# Patient Record
Sex: Female | Born: 1955 | Race: White | Hispanic: No | Marital: Single | Smoking: Never smoker
Health system: Southern US, Community
[De-identification: ages and names within clinical notes are randomized; demographics above are authoritative.]

## PROBLEM LIST (undated history)

## (undated) DIAGNOSIS — E7800 Pure hypercholesterolemia, unspecified: Secondary | ICD-10-CM

## (undated) HISTORY — PX: OTHER SURGICAL HISTORY: SHX169

## (undated) HISTORY — PX: SPLENECTOMY, TOTAL: SHX788

---

## 2016-08-03 ENCOUNTER — Encounter (HOSPITAL_COMMUNITY): Payer: Self-pay | Admitting: *Deleted

## 2016-08-03 ENCOUNTER — Emergency Department (HOSPITAL_COMMUNITY)
Admission: EM | Admit: 2016-08-03 | Discharge: 2016-08-03 | Disposition: A | Discharge status: Home / Self Care | Payer: Self-pay | Attending: Emergency Medicine | Admitting: Emergency Medicine

## 2016-08-03 DIAGNOSIS — Y999 Unspecified external cause status: Secondary | ICD-10-CM | POA: Insufficient documentation

## 2016-08-03 DIAGNOSIS — R220 Localized swelling, mass and lump, head: Secondary | ICD-10-CM

## 2016-08-03 DIAGNOSIS — Y939 Activity, unspecified: Secondary | ICD-10-CM | POA: Insufficient documentation

## 2016-08-03 DIAGNOSIS — Y929 Unspecified place or not applicable: Secondary | ICD-10-CM | POA: Insufficient documentation

## 2016-08-03 DIAGNOSIS — S0083XA Contusion of other part of head, initial encounter: Secondary | ICD-10-CM | POA: Insufficient documentation

## 2016-08-03 DIAGNOSIS — W01198A Fall on same level from slipping, tripping and stumbling with subsequent striking against other object, initial encounter: Secondary | ICD-10-CM | POA: Insufficient documentation

## 2016-08-03 NOTE — ED Triage Notes (Signed)
To ED for eval of facial and eye swelling. Pt states she was moving a dresser on Wednesday when then dresser fell and hit her in forehead. Pt states she had a big 'knot' on her head that she had been icing since. Thursday and Friday noted bruising in her face. The bruising is moving down her face so she wanted to make sure this was ok. Denies pain, dizziness, nausea, vomiting, visual changes.

## 2016-08-03 NOTE — Discharge Instructions (Addendum)
Follow-up with her primary care doctor next 24-48 hours. If you do not have a primary care doctor you can use one listed below.   Stopped taking aspirin.  Return the emergency department immediately for any vision changes, headache, difficulty walking, numbness/weakness of her arms or legs or any other worsening or concerning symptoms.  If you do not have a primary care doctor you see regularly, please you the list below. Please call them to arrange for follow-up.    No Primary Care Doctor Call [facility]  [phone] Other agencies that provide inexpensive medical care    [facility]  [phone]    [facility]  [phone]    [facility]  [phone]    [facility]  [phone]    [facility]  [phone]    [facility], [facility], [facility]  [phone]

## 2016-08-03 NOTE — ED Provider Notes (Signed)
[facility]-EMERGENCY DEPT Provider Note   CSN: [id number] Arrival date & time: 08/03/16  1551   By signing my name below, I, Teofilo PodMatthew P. Jamison, attest that this documentation has been prepared under the direction and in the presence of Graciella FreerLindsey Layden, [place]-C. Electronically Signed: Teofilo PodMatthew P. Jamison, ED Scribe. 08/03/2016. 5:21 PM.   History   Chief Complaint Chief Complaint  Patient presents with  . Facial Swelling    The history is provided by the patient. No language interpreter was used.   HPI Comments:  Yesenia Galvan is a 61 y.o. female who presents to the Emergency Department complaining of worsening facial swelling that began 3 days ago. 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 She denies any fall or hitting her head. She remembers the event, and denies LOC. Pt notes worsening bruises on her face, and states that the bruising has been gradually moving down her face. No alleviating factors noted.  Pt does not take any medications regularly. She is not currently on any blood thinners. She took 1x dose of ASA this morning.  She denies any visual changes, nausesa/vomiting, dizziness, weakness of her arms or legs headaches.    History reviewed. No pertinent past medical history.  There are no active problems to display for this patient.   History reviewed. No pertinent surgical history.  OB History    No data available       Home Medications    Prior to Admission medications   Not on File    Family History No family history on file.  Social History Social History  Substance Use Topics  . Smoking status: Never Smoker  . Smokeless tobacco: Never Used  . Alcohol use No     Allergies   Patient has no allergy information on record.   Review of Systems Review of Systems  Constitutional: Negative for fever.  HENT: Positive for facial swelling.   Eyes: Negative for visual disturbance.    Respiratory: Negative for cough and shortness of breath.   Cardiovascular: Negative for chest pain.  Gastrointestinal: Negative for abdominal pain, nausea and vomiting.  Skin: Positive for color change.  Neurological: Negative for weakness, light-headedness, numbness and headaches.     Physical Exam Updated Vital Signs BP 140/88 (BP Location: Left Arm)   Pulse 62   Temp 97.9 F (36.6 C) (Oral)   Resp 16   SpO2 96%   Physical Exam  Constitutional: She appears well-developed and well-nourished. No distress.  Sitting comfortably on examination table.   HENT:  Head: Normocephalic.  Right Ear: Tympanic membrane normal. No hemotympanum.  Left Ear: Tympanic membrane normal. No hemotympanum.  Mouth/Throat: Uvula is midline, oropharynx is clear and moist and mucous membranes are normal.  Hematoma to the left frontal scalp. She has diffuse ecchymosis surrounding bilateral periorbital regions. No periorbital tenderness to palpation. No deformities or crepitus noted. No tenderness or deformities to nasal bridge. Dentition is intact. Elevation/depression and lateral movement of dull is intact without difficulty. No evidence of Battle signs  Eyes: Conjunctivae and EOM are normal. Pupils are equal, round, and reactive to light.  EOMs fully intact without difficulty.   Cardiovascular: Normal rate.   Pulmonary/Chest: Effort normal.  Abdominal: She exhibits no distension.  Neurological: She is alert. GCS eye subscore is 4. GCS verbal subscore is 5. GCS motor subscore is 6.  Cranial nerves III-XII intact Follows commands, Moves all extremities  5/5 strength to BUE and BLE  Sensation intact throughout  Normal finger to nose. No dysdiadochokinesia. No pronator drift. No slurred speech. No facial droop.   Skin: Skin is warm and dry.  Psychiatric: She has a normal mood and affect.  Nursing note and vitals reviewed.    ED Treatments / Results  DIAGNOSTIC STUDIES:  Oxygen Saturation is 96%  on RA, normal by my interpretation.    COORDINATION OF CARE:  5:19 PM Discussed treatment plan with pt at bedside and pt agreed to plan.   Labs (all labs ordered are listed, but only abnormal results are displayed) Labs Reviewed - No data to display  EKG  EKG Interpretation None       Radiology No results found.  Procedures Procedures (including critical care time)  Medications Ordered in ED Medications - No data to display   Initial Impression / Assessment and Plan / ED Course  I have reviewed the triage vital signs and the nursing notes.  Pertinent labs & imaging results that were available during my care of the patient were reviewed by me and considered in my medical decision making (see chart for details).     61 year old female who presents with facial ecchymosis after a dresser fell on her face. Patient is afebrile, non-toxic appearing, sitting comfortably on examination table. Physical exam with no neuro deficits. No history of loss of consciousness, vomiting, dizziness, headache. Low suspicion for orbital wall fracture given that patient has no tenderness to palpation and EOMs are intact without pain. Given ecchymosis, will obtain CT orbit to evaluate.   Patient asked to see provider. She does not want to stay and wait for the CT orbit. She states that she feels fine and since she is not having any symptoms, does not wish to wait for it. Explained to her that ecchymosis will tend to travel downwards due to gravity, but also explained that without a CT orbit, I can't be sure she doesn't have an underlying fracture. Patient states that she understands the risk of leaving without a CT and wishes to do so anyway. Re-examination shows that patient still has full EOMs and has no tenderness to palpation. Feel it is reasonable for patient to be discharged at this time. Provided patient with a list of clinic resources to use if he does not have a PCP. Instructed to call them today  to arrange follow-up in the next 24-48 hours. Return precautions discussed. Patient expresses understanding and agreement to plan.    Final Clinical Impressions(s) / ED Diagnoses   Final diagnoses:  Facial swelling  Contusion of face, initial encounter    New Prescriptions New Prescriptions   No medications on file  I personally performed the services described in this documentation, which was scribed in my presence. The recorded information has been reviewed and is accurate.     Maxwell Caul, PA-C 08/04/16 Whitney Post    Linwood Dibbles, MD 08/04/16 [phone]

## 2016-08-03 NOTE — ED Notes (Signed)
Declined W/C at D/C and was escorted to lobby by RN. 

## 2017-03-09 ENCOUNTER — Other Ambulatory Visit: Payer: Self-pay

## 2017-03-09 ENCOUNTER — Emergency Department (HOSPITAL_COMMUNITY)
Admission: EM | Admit: 2017-03-09 | Discharge: 2017-03-09 | Disposition: A | Discharge status: Home / Self Care | Payer: Self-pay | Attending: Physician Assistant | Admitting: Physician Assistant

## 2017-03-09 ENCOUNTER — Emergency Department (HOSPITAL_COMMUNITY): Payer: Self-pay

## 2017-03-09 ENCOUNTER — Encounter (HOSPITAL_COMMUNITY): Payer: Self-pay

## 2017-03-09 DIAGNOSIS — Y929 Unspecified place or not applicable: Secondary | ICD-10-CM | POA: Insufficient documentation

## 2017-03-09 DIAGNOSIS — W010XXA Fall on same level from slipping, tripping and stumbling without subsequent striking against object, initial encounter: Secondary | ICD-10-CM | POA: Insufficient documentation

## 2017-03-09 DIAGNOSIS — S82142A Displaced bicondylar fracture of left tibia, initial encounter for closed fracture: Secondary | ICD-10-CM | POA: Insufficient documentation

## 2017-03-09 DIAGNOSIS — Y999 Unspecified external cause status: Secondary | ICD-10-CM | POA: Insufficient documentation

## 2017-03-09 DIAGNOSIS — Y93H2 Activity, gardening and landscaping: Secondary | ICD-10-CM | POA: Insufficient documentation

## 2017-03-09 MED ORDER — ACETAMINOPHEN 325 MG PO TABS
650.0000 mg | ORAL_TABLET | Freq: Once | ORAL | Status: AC
  Administered 2017-03-09: 650 mg via ORAL
  Filled 2017-03-09: qty 2

## 2017-03-09 NOTE — ED Triage Notes (Signed)
Pt states while doing yard today she stepped on a branch that rolled and she fell landing on left leg. Pt states when she attempted to get up her leg wouldn't hold her weight. Pt reports she heard a "Crack".

## 2017-03-09 NOTE — Discharge Instructions (Signed)
Your x-ray and CT shows that you have a fracture of the left tibial plateau.  I spoke with Dr. Eulah PontMurphy with orthopedics, he will see you in his office at 2 PM tomorrow you can call the office tomorrow morning.  All your other imaging was unremarkable.  You may continue to use Tylenol for pain.  It is very important that you remain in the knee immobilizer and use crutches, and are completely nonweightbearing on the left leg.  If you have significantly worsening pain, numbness or weakness of the leg or other new or concerning symptoms you may return to the ED sooner for reevaluation.

## 2017-03-09 NOTE — ED Provider Notes (Signed)
[facility] EMERGENCY DEPARTMENT Provider Note   CSN: [id number] Arrival date & time: 03/09/17  1531     History   Chief Complaint Chief Complaint  Patient presents with  . Leg Injury    HPI Yesenia Galvan is a 61 y.o. female.  Yesenia Galvan is a 61 y.o. Female otherwise healthy, presents complaining of left lower leg pain.  Patient reports she was out doing yard work today stepped on a branch and rolled her left lower leg and fell landing on the left leg, reports since then she feels like she cannot bear weight on the leg, and reports it seems to buckle when she tries to stand up on it.  Patient reports she heard a crack.  Since then she has had mild pain on the lateral aspect just distal to the knee.  Reports more than the pain she was concerned she could not bear weight on the leg.  Patient denies any redness, bruising or obvious deformity.  Patient denies any other injury from the fall did not hit her head.  Denies any pain at the hip or upper leg, no back pain. Denies any numbness or tingling.      History reviewed. No pertinent past medical history.  There are no active problems to display for this patient.   History reviewed. No pertinent surgical history.  OB History    No data available       Home Medications    Prior to Admission medications   Not on File    Family History No family history on file.  Social History Social History   Tobacco Use  . Smoking status: Never Smoker  . Smokeless tobacco: Never Used  Substance Use Topics  . Alcohol use: No  . Drug use: No     Allergies   Patient has no known allergies.   Review of Systems Review of Systems  Constitutional: Negative for chills and fever.  Musculoskeletal:       L lower leg pain  Skin: Negative for rash and wound.  Neurological: Negative for seizures and weakness.     Physical Exam Updated Vital Signs BP 112/80 (BP Location: Right Arm)   Pulse 88   Temp 98.2 F  (36.8 C) (Oral)   Resp 16   Ht 5\' 4"  (1.626 m)   Wt 99.8 kg (220 lb)   SpO2 96%   BMI 37.76 kg/m   Physical Exam  Constitutional: She appears well-developed and well-nourished. No distress.  HENT:  Head: Normocephalic and atraumatic.  Eyes: Right eye exhibits no discharge. Left eye exhibits no discharge.  Pulmonary/Chest: Effort normal. No respiratory distress.  Musculoskeletal:  Mild pain to palpation over the left lateral aspect of the lower leg lateral aspect of the lower leg, no obvious palpable deformity, no bruising, redness or warmth, normal range of motion at the knee and ankle, mild discomfort with knee ROM.  Intact distally, and 2+ DP and TP pulses with good capillary refill, 5/5 dorsi and plantar flexion  Neurological: She is alert. Coordination normal.  Skin: Skin is warm and dry. Capillary refill takes less than 2 seconds. She is not diaphoretic.  Psychiatric: She has a normal mood and affect. Her behavior is normal.  Nursing note and vitals reviewed.    ED Treatments / Results  Labs (all labs ordered are listed, but only abnormal results are displayed) Labs Reviewed - No data to display  EKG  EKG Interpretation None  Radiology Dg Tibia/fibula Left  Result Date: 03/09/2017 CLINICAL DATA:  Injured leg while working in the garden. EXAM: LEFT TIBIA AND FIBULA - 2 VIEW COMPARISON:  None. FINDINGS: Moderate degenerative changes are noted at the knee joint. The ankle joint is maintained. No acute fracture of the tibia or fibula is identified. IMPRESSION: No acute bony findings. Electronically Signed   By: Rudie MeyerP.  Gallerani M.D.   On: 03/09/2017 17:54   Dg Ankle Complete Left  Result Date: 03/09/2017 CLINICAL DATA:  Larey SeatFell.  Left ankle pain. EXAM: LEFT ANKLE COMPLETE - 3+ VIEW COMPARISON:  None. FINDINGS: The ankle mortise is maintained. No acute ankle fracture. No ankle joint effusion. The mid and hindfoot bony structures are intact. IMPRESSION: No acute left ankle  fracture. Electronically Signed   By: Rudie MeyerP.  Gallerani M.D.   On: 03/09/2017 17:57   Ct Knee Left Wo Contrast  Result Date: 03/09/2017 CLINICAL DATA:  Evaluate possible tibial plateau fracture. EXAM: CT OF THE left KNEE WITHOUT CONTRAST TECHNIQUE: Multidetector CT imaging of the left foot knee was performed according to the standard protocol. Multiplanar CT image reconstructions were also generated. COMPARISON:  Radiographs 03/09/2017 FINDINGS: There is a depressed lateral tibial plateau fracture posteriorly. Maximum depression is estimated at 6.5 mm. Mild comminution. The tibial spines are intact and the medial tibial plateau is intact. No femur, fibula or patella fractures. 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 The cruciate and collateral ligaments are grossly intact by CT. The quadriceps and patellar tendons are intact. IMPRESSION: Mildly depressed (6.5 mm) lateral tibial plateau fracture posteriorly. No other fractures are identified. 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 Electronically Signed   By: Rudie MeyerP.  Gallerani M.D.   On: 03/09/2017 18:58   Dg Knee Complete 4 Views Left  Result Date: 03/09/2017 CLINICAL DATA:  Injured knee while working in the garden. EXAM: LEFT KNEE - COMPLETE 4+ VIEW COMPARISON:  None. FINDINGS: Moderate knee joint degenerative changes, most notably in the medial compartment with joint space narrowing and spurring. Irregularity of the lateral tibial plateau on the oblique film. Could not exclude a fracture involving the posterior aspect of the lateral tibia. Moderate-sized joint effusion and possible lipohemarthrosis. IMPRESSION: 1. Findings suspicious for a lateral tibial plateau fracture posteriorly. Recommend CT for further evaluation. 2. Moderate-sized joint effusion and possible lipohemarthrosis. Electronically Signed   By: Rudie MeyerP.  Gallerani M.D.   On: 03/09/2017 17:57   Dg Knee Complete 4 Views Right  Result Date: 03/09/2017 CLINICAL DATA:  Status post fall today with right knee  pain. EXAM: RIGHT KNEE - COMPLETE 4+ VIEW COMPARISON:  None. FINDINGS: No evidence of fracture, dislocation, or joint effusion. There is tibial spine osteophytosis and narrowing of the femoral tibial joint space. Soft tissues are unremarkable. IMPRESSION: No acute fracture.  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 Electronically Signed   By: Sherian ReinWei-Chen  Lin M.D.   On: 03/09/2017 20:52    Procedures Procedures (including critical care time)  Medications Ordered in ED Medications  acetaminophen (TYLENOL) tablet 650 mg (650 mg Oral Given 03/09/17 1857)     Initial Impression / Assessment and Plan / ED Course  I have reviewed the triage vital signs and the nursing notes.  Pertinent labs & imaging results that were available during my care of the patient were reviewed by me and considered in my medical decision making (see chart for details).  Patient presents complaining of right lower extremity pain after a fall today while doing yard work.  Left lower extremity is neurovascularly intact, with normal range  of motion.  No obvious deformity on exam, no erythema, warmth or fevers to suggest septic joint. Will get x-rays of left knee, tib-fib and ankle.  X-rays of left tib-fib and ankle are without acute abnormalities.  Left knee x-ray shows possible posterior tibial plateau fracture, radiology recommends follow-up with CT. CT shows lateral tibial plateau fracture with 6.5 mm of depression, with mild lympho-hemarthrosis.  Consult orthopedics regarding these results for recommendations on follow-up.  Spoke with Dr. Eulah PontMurphy with orthopedics regarding, who recommends knee immobilizer and crutches, pt to be strictly non-weight bearing, Dr. Eulah PontMurphy will see pt in his office tomorrow at 2 PM.  Gust these results with the patient, at this time she is reporting some mild right knee pain, will get x-ray to ensure no injury to that knee.  Right knee x-ray shows some chronic degenerative changes but no acute  fractures or abnormalities.  Patient is stable for discharge home with knee immobilizer and crutches, strict instructions on nonweightbearing.  Tylenol has been managing patient's pain while here in the ED, she is not requesting anything additional for pain here today.  Patient in no acute distress at discharge with no further questions.  Final Clinical Impressions(s) / ED Diagnoses   Final diagnoses:  Closed fracture of left tibial plateau, initial encounter    ED Discharge Orders    None       Legrand RamsFord, Kelsey N, PA-C 03/09/17 2114    Abelino DerrickMackuen, Courteney Lyn, MD 03/11/17 2208

## 2017-03-09 NOTE — Progress Notes (Signed)
Orthopedic Tech Progress Note Patient Details:  Mikey BussingMary Peretz 10/17/1955 [id number]  Ortho Devices Type of Ortho Device: Crutches, Knee Immobilizer Ortho Device/Splint Location: lle Ortho Device/Splint Interventions: Ordered, Application, Adjustment  pt was having trouble with crutches. I alerted dr to this. I left crutches with pt and dr said she would talk to pt about what they were going to do about pt getting around.  Post Interventions Patient Tolerated: Well Instructions Provided: Care of device, Adjustment of device   Trinna PostMartinez, Azara Gemme J 03/09/2017, 9:58 PM

## 2019-02-13 IMAGING — DX DG KNEE COMPLETE 4+V*L*
4 series · 4 of 4 positions shown · non-contrast
Comparison: None.

CLINICAL DATA: Injured knee while working in the garden.

EXAM:
LEFT KNEE - COMPLETE 4+ VIEW

[t knee ap left]
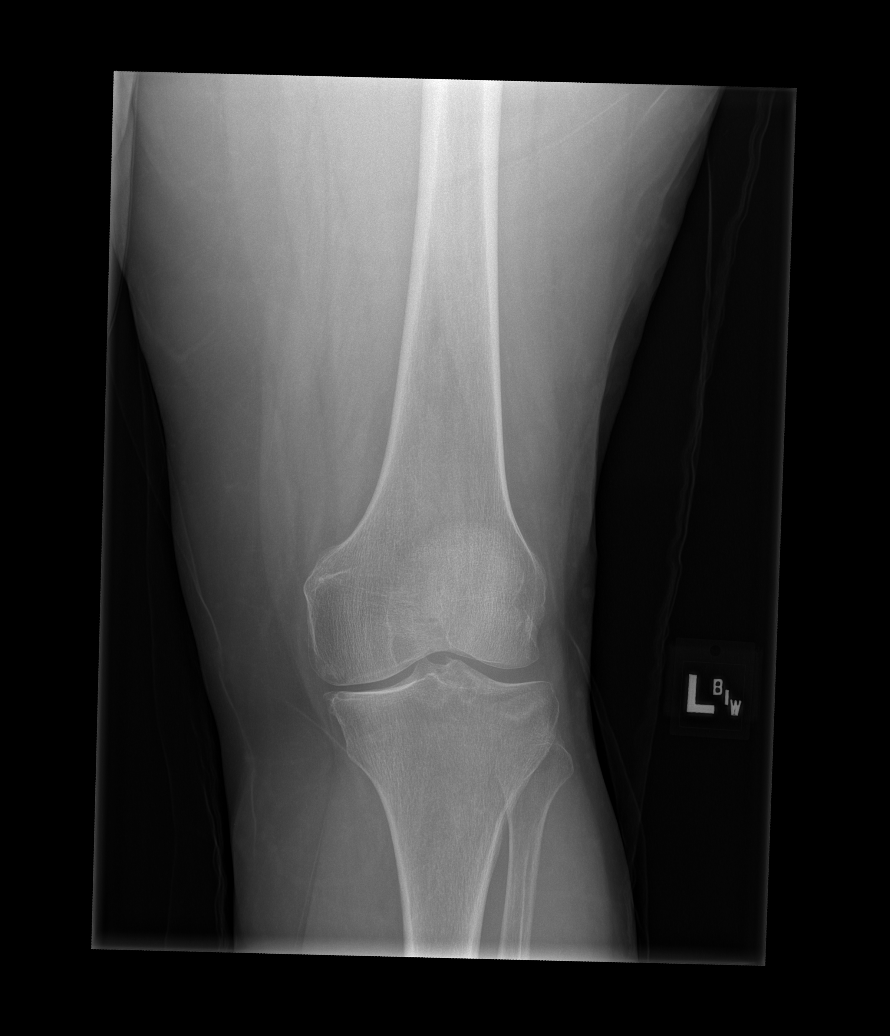

[t knee obl left (1 of 2)]
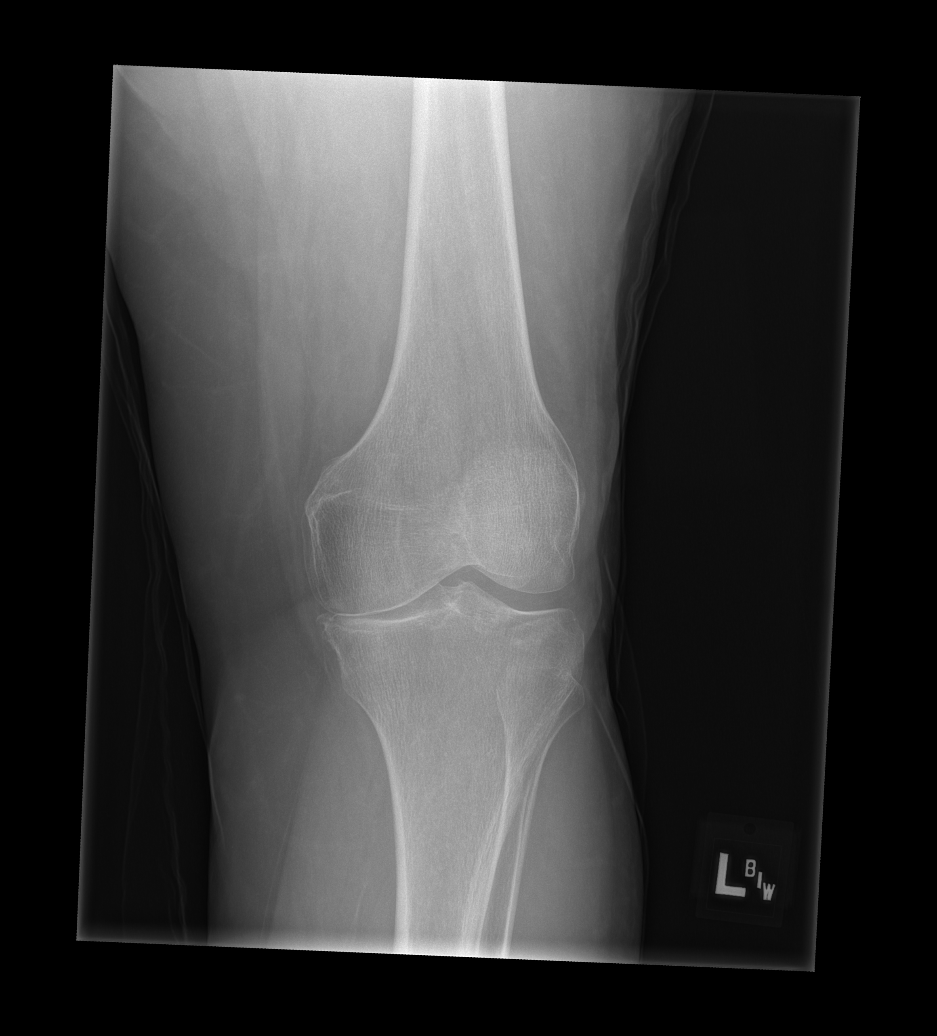

[t knee obl left (2 of 2)]
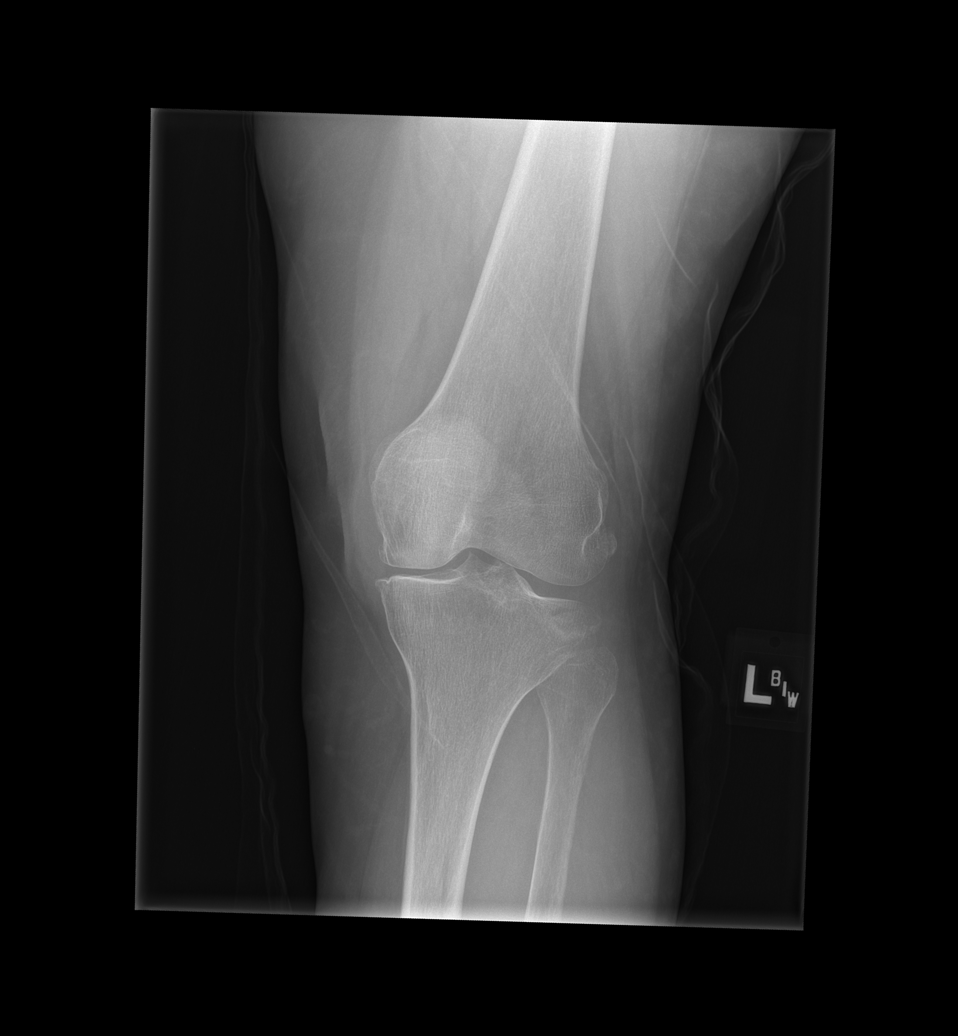

[t knee lat left]
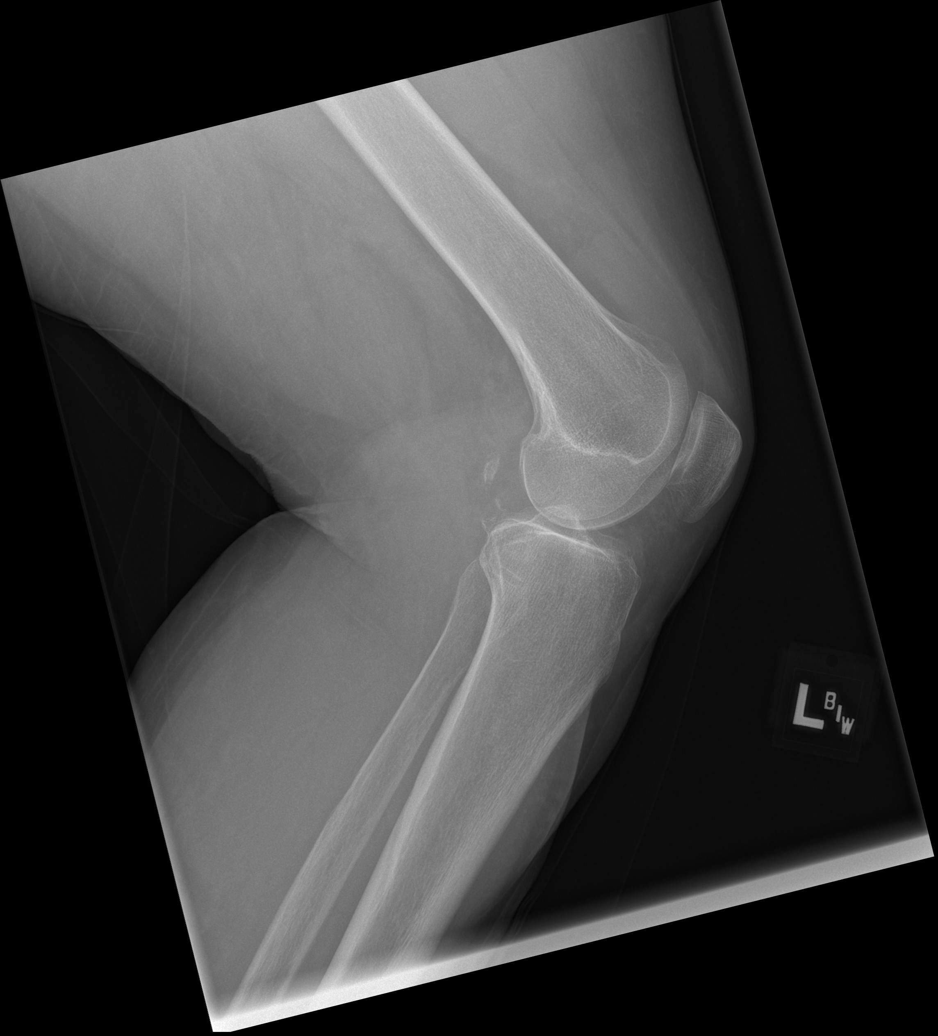

[4 of 4 positions shown; findings below may reference images not displayed]

FINDINGS: Moderate knee joint degenerative changes, most notably in the medial
compartment with joint space narrowing and spurring.

Irregularity of the lateral tibial plateau on the oblique film.
Could not exclude a fracture involving the posterior aspect of the
lateral tibia. Moderate-sized joint effusion and possible
lipohemarthrosis.
IMPRESSION: 1. Findings suspicious for a lateral tibial plateau fracture
posteriorly. Recommend CT for further evaluation.
2. Moderate-sized joint effusion and possible lipohemarthrosis.

## 2019-02-13 IMAGING — CT CT KNEE*L* W/O CM
3 series · 16 of 33 positions shown, 19 images · non-contrast
Comparison: Radiographs 03/09/2017

CLINICAL DATA: Evaluate possible tibial plateau fracture.

EXAM:
CT OF THE left KNEE WITHOUT CONTRAST
TECHNIQUE: Multidetector CT imaging of the left foot knee was performed
according to the standard protocol. Multiplanar CT image
reconstructions were also generated.

[Series 4: extremity soft tissue · axial · 0.36mm/px · z∈[-828,-660]mm · 8 of 100 slices shown, 10 images]
[im 8/100  soft-tissue]
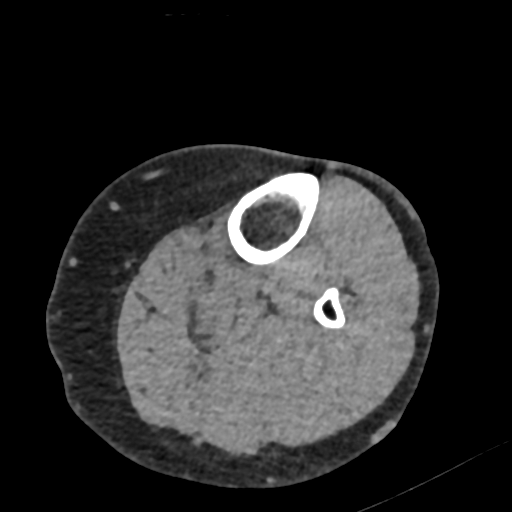
[im 8/100  bone]
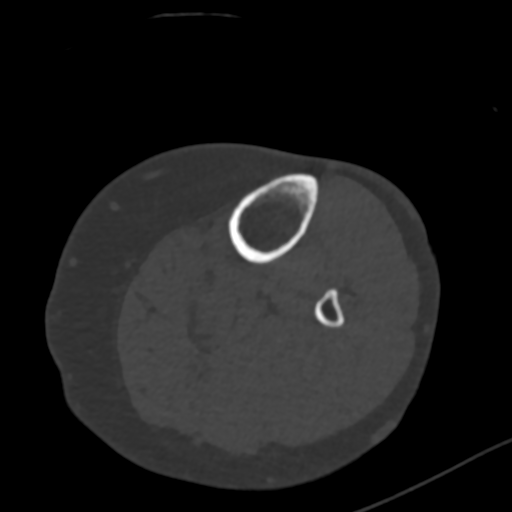
[im 23/100  bone]
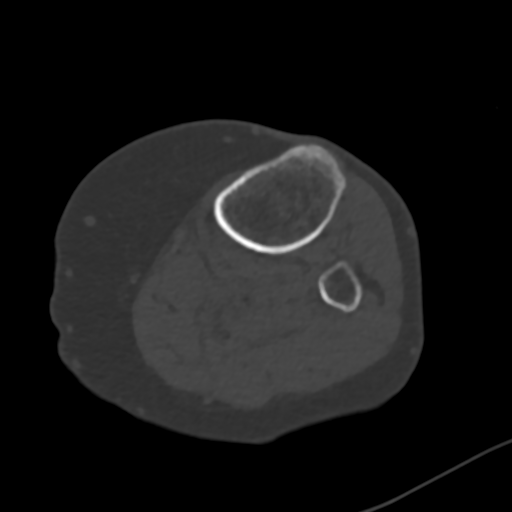
[im 31/100  bone]
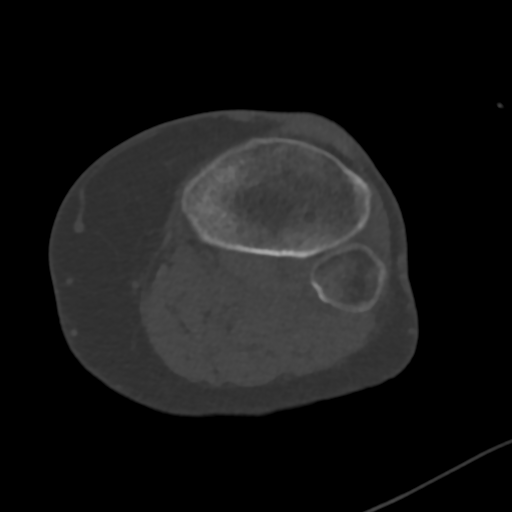
[im 46/100  bone]
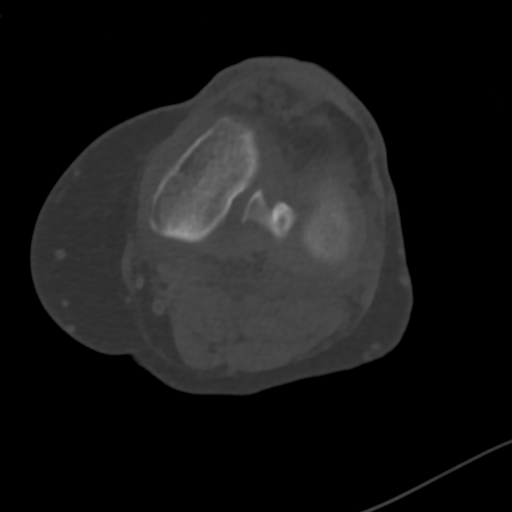
[im 54/100  soft-tissue]
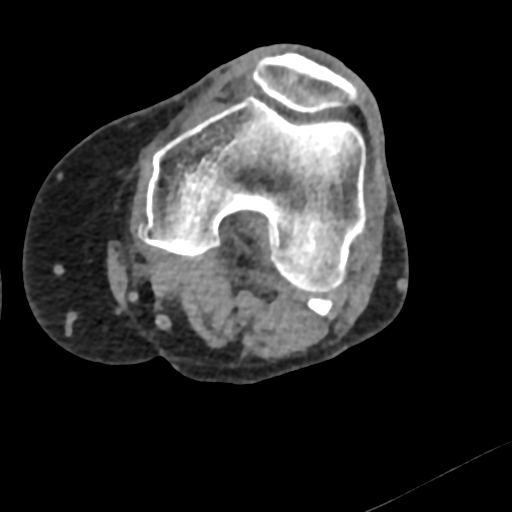
[im 54/100  bone]
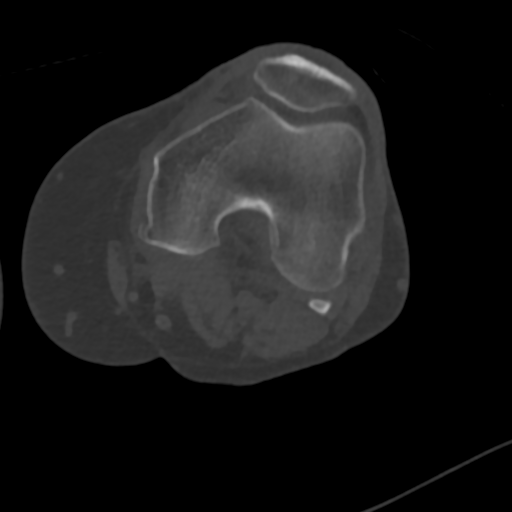
[im 69/100  bone]
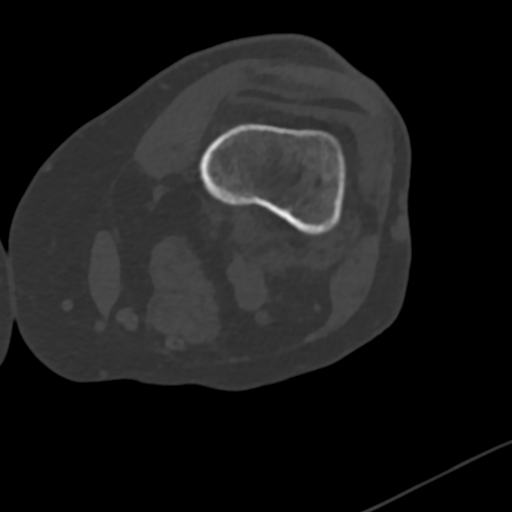
[im 77/100  bone]
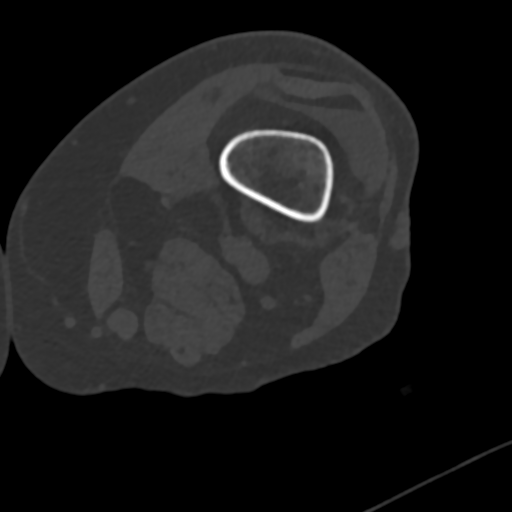
[im 92/100  bone]
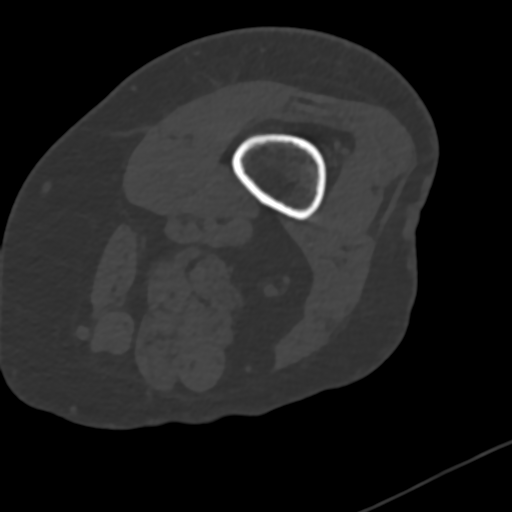

[Series 8: cor soft tissue · coronal · 0.39mm/px · 3 of 99 slices shown]
[im 20/99  bone]
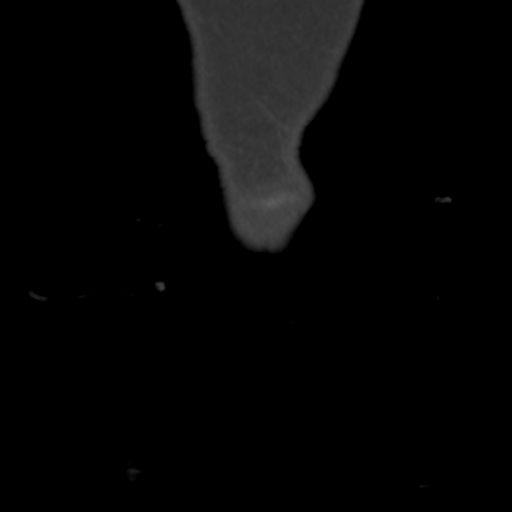
[im 40/99  bone]
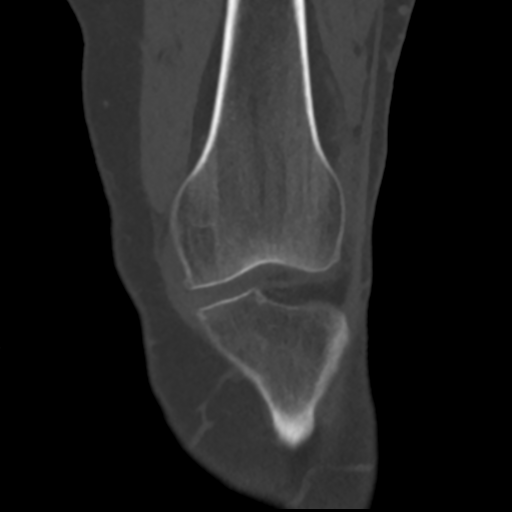
[im 59/99  bone]
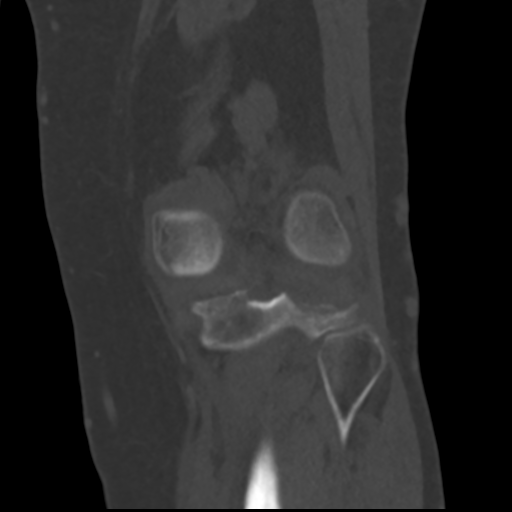

[Series 9: sag soft tissue · sagittal · 0.39mm/px · 5 of 87 slices shown, 6 images]
[im 29/87  bone]
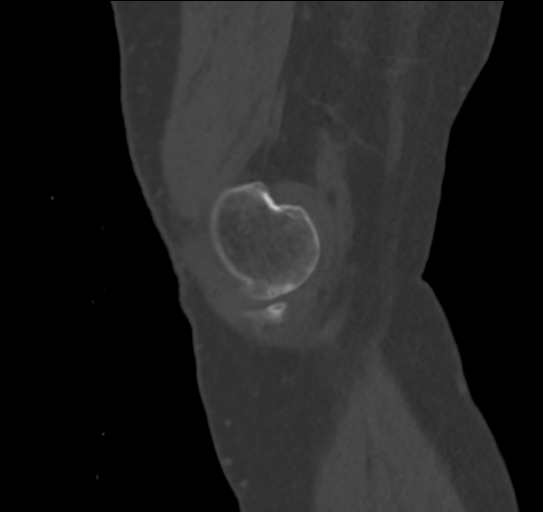
[im 36/87  bone]
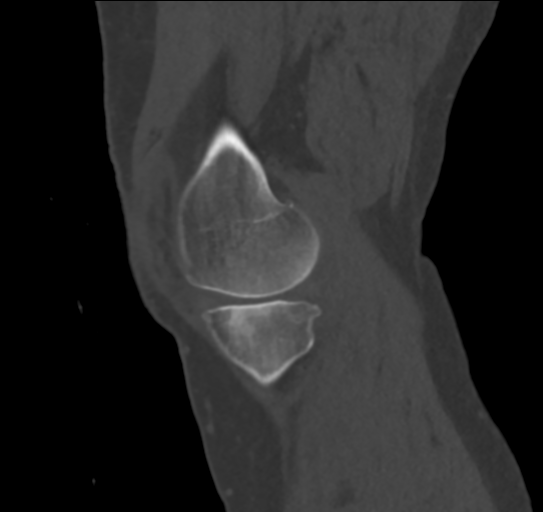
[im 44/87  soft-tissue]
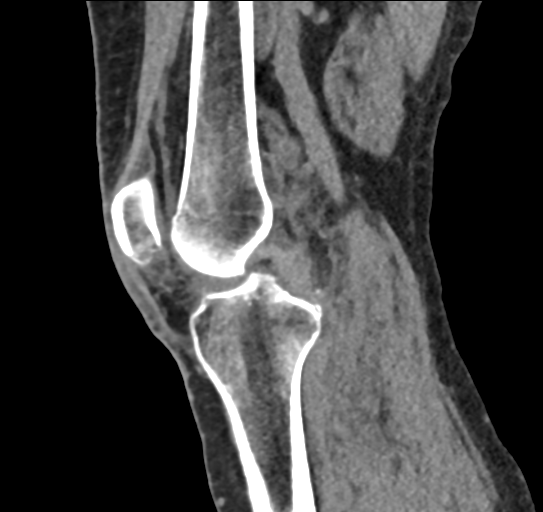
[im 44/87  bone]
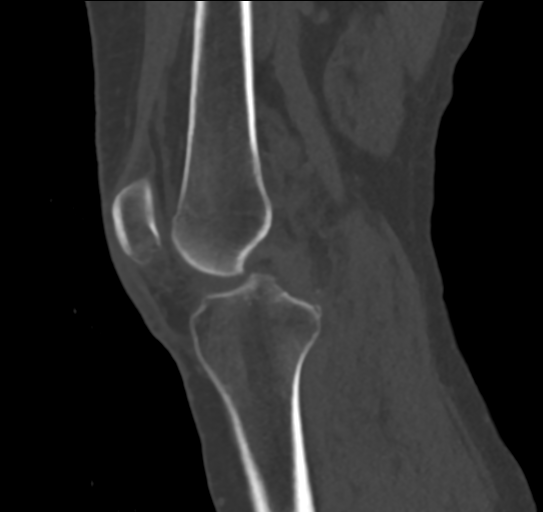
[im 51/87  bone]
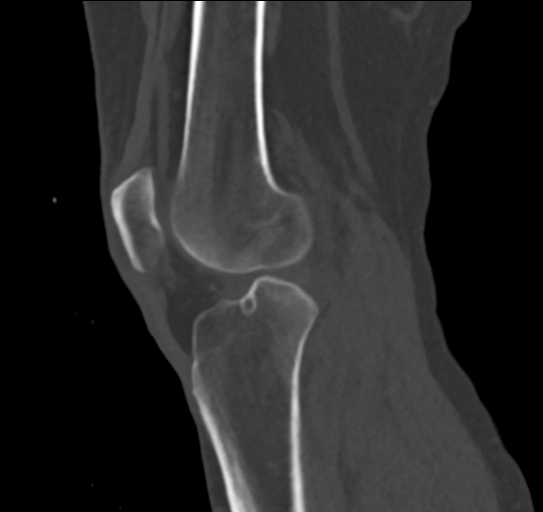
[im 58/87  bone]
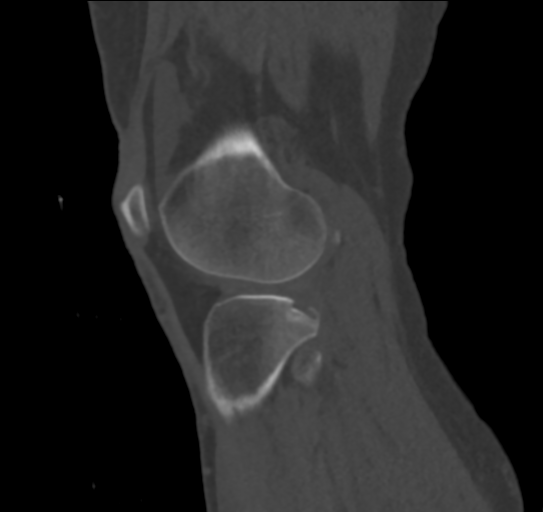

[16 of 33 positions shown; findings below may reference images not displayed]

FINDINGS: There is a depressed lateral tibial plateau fracture posteriorly.
Maximum depression is estimated at 6.5 mm. Mild comminution. The
tibial spines are intact and the medial tibial plateau is intact. No
femur, fibula or patella fractures.

Moderate to large lipohemarthrosis.

The cruciate and collateral ligaments are grossly intact by CT. The
quadriceps and patellar tendons are intact.
IMPRESSION: Mildly depressed (6.5 mm) lateral tibial plateau fracture
posteriorly.

No other fractures are identified.

Moderate to large lipohemarthrosis.

## 2022-01-10 ENCOUNTER — Emergency Department (HOSPITAL_COMMUNITY)
Admission: EM | Admit: 2022-01-10 | Discharge: 2022-01-10 | Disposition: A | Discharge status: Home / Self Care | Payer: No Typology Code available for payment source | Attending: Emergency Medicine | Admitting: Emergency Medicine

## 2022-01-10 ENCOUNTER — Emergency Department (HOSPITAL_COMMUNITY): Payer: No Typology Code available for payment source

## 2022-01-10 ENCOUNTER — Other Ambulatory Visit: Payer: Self-pay

## 2022-01-10 ENCOUNTER — Encounter (HOSPITAL_COMMUNITY): Payer: Self-pay | Admitting: Emergency Medicine

## 2022-01-10 DIAGNOSIS — S6991XA Unspecified injury of right wrist, hand and finger(s), initial encounter: Secondary | ICD-10-CM | POA: Diagnosis present

## 2022-01-10 DIAGNOSIS — Y990 Civilian activity done for income or pay: Secondary | ICD-10-CM | POA: Diagnosis not present

## 2022-01-10 DIAGNOSIS — S52571A Other intraarticular fracture of lower end of right radius, initial encounter for closed fracture: Secondary | ICD-10-CM | POA: Diagnosis not present

## 2022-01-10 DIAGNOSIS — S52501A Unspecified fracture of the lower end of right radius, initial encounter for closed fracture: Secondary | ICD-10-CM

## 2022-01-10 DIAGNOSIS — M25531 Pain in right wrist: Secondary | ICD-10-CM | POA: Insufficient documentation

## 2022-01-10 DIAGNOSIS — S52691A Other fracture of lower end of right ulna, initial encounter for closed fracture: Secondary | ICD-10-CM | POA: Insufficient documentation

## 2022-01-10 DIAGNOSIS — S52601A Unspecified fracture of lower end of right ulna, initial encounter for closed fracture: Secondary | ICD-10-CM

## 2022-01-10 DIAGNOSIS — W1839XA Other fall on same level, initial encounter: Secondary | ICD-10-CM | POA: Diagnosis not present

## 2022-01-10 HISTORY — DX: Pure hypercholesterolemia, unspecified: E78.00

## 2022-01-10 MED ORDER — HYDROCODONE-ACETAMINOPHEN 5-325 MG PO TABS
1.0000 | ORAL_TABLET | Freq: Once | ORAL | Status: AC
  Administered 2022-01-10: 1 via ORAL
  Filled 2022-01-10: qty 1

## 2022-01-10 NOTE — ED Triage Notes (Signed)
Right wrist swelling noted. Able to wiggle fingers and radial pulses present. Ice applied

## 2022-01-10 NOTE — Discharge Instructions (Addendum)
You were evaluated in the emergency department for right wrist pain.  Your overall work-up today shows a fracture of the radius and the ulna.  You were provided the contact information for a local orthopedic hand specialist by the name of Dr. Greta Doom.  Please call first thing tomorrow morning to schedule your follow-up appointment with him on Monday.  You were placed in a splint and sling today.  Please do not take off the splint, however you may take off the sling as needed.  Keep the arm elevated and continue to manage pain with ibuprofen and Tylenol.  You may take 600 to 800 mg ibuprofen every 6-8 hours.  You may take Tylenol 1000 mg every 6 hours.  It is usually best alternate between these two.  Return to the ED for any new or worsening symptoms as discussed.

## 2022-01-10 NOTE — ED Triage Notes (Signed)
Pt at work, fell and landed on right wrist. Nad. Denies hitting head.

## 2022-01-10 NOTE — ED Provider Notes (Signed)
[facility] EMERGENCY DEPARTMENT Provider Note   CSN: [id number] Arrival date & time: 01/10/22  1731     History  Chief Complaint  Patient presents with   Wrist Pain    Yesenia Galvan is a 66 y.o. female with Hx of hypercholesterolemia and prior left hand surgery presenting to the ED today due to right wrist injury.  States she was at work, fell and landed on her outstretched right hand/wrist.  Noticed instant pain and swelling.  Denies hitting head, LOC, or other bodily injuries from fall.  Denies lightheadedness, dizziness, chest pain, shortness of breath prior to or following the fall.  Denies numbness or tingling of the fingers or hand.  No other complaints at this time.  The history is provided by the patient and medical records.  Wrist Pain      Home Medications Prior to Admission medications   Not on File      Allergies    Patient has no known allergies.    Review of Systems   Review of Systems  Musculoskeletal:        Right wrist pain    Physical Exam Updated Vital Signs BP 132/89 (BP Location: Right Arm)   Pulse 74   Temp 98.1 F (36.7 C)   Resp 18   SpO2 100%  Physical Exam Vitals and nursing note reviewed.  Constitutional:      General: She is not in acute distress.    Appearance: She is well-developed.  HENT:     Head: Normocephalic and atraumatic.  Eyes:     Conjunctiva/sclera: Conjunctivae normal.  Cardiovascular:     Rate and Rhythm: Normal rate and regular rhythm.     Heart sounds: No murmur heard. Pulmonary:     Effort: Pulmonary effort is normal. No respiratory distress.     Breath sounds: Normal breath sounds.  Abdominal:     Palpations: Abdomen is soft.     Tenderness: There is no abdominal tenderness.  Musculoskeletal:        General: Swelling, tenderness and deformity present.     Cervical back: Neck supple.     Comments: Right wrist with obvious deformity and notable tenderness.  No evidence of open wound.  CRT less than 2.  Radial  pulse 2+.  Fingers and hand appear neurovascularly intact.  Still able to wiggle fingers easily.  No significant anatomical snuffbox tenderness.  ROM of wrist deferred due to possible fracture.  Skin:    General: Skin is warm and dry.     Capillary Refill: Capillary refill takes less than 2 seconds.  Neurological:     Mental Status: She is alert.  Psychiatric:        Mood and Affect: Mood normal.     ED Results / Procedures / Treatments   Labs (all labs ordered are listed, but only abnormal results are displayed) Labs Reviewed - No data to display  EKG None  Radiology DG Wrist Complete Right  Result Date: 01/10/2022 CLINICAL DATA:  Deformity.  Fell landing on right wrist at work. EXAM: RIGHT WRIST - COMPLETE 3+ VIEW COMPARISON:  None Available. FINDINGS: There is diffuse decreased bone mineralization. There is a markedly comminuted fracture of the distal radial metaphysis extending through the epiphysis including the radial styloid. There is intra-articular extension. There is up to 6 mm lateral and 4 mm volar displacement of the dominant comminuted fracture fragments. There is up to approximately 6 mm transverse diastasis of the fracture lines extending through the distal  radial articular surface. Mild dorsal apex angulation of the fracture. Additional oblique fracture of the distal ulnar metadiaphysis with mild impaction and minimal 2 mm lateral displacement of the distal fracture component with respect to the proximal fracture component. Moderate to high-grade surrounding soft tissue swelling. IMPRESSION: 1. Markedly comminuted intra-articular fracture of the distal radius with mild dorsal apex angulation. 2. Mildly displaced and impacted oblique fracture of the distal ulnar metadiaphysis. Electronically Signed   By: Neita Garnet M.D.   On: 01/10/2022 18:24    Procedures Procedures    Medications Ordered in ED Medications  HYDROcodone-acetaminophen (NORCO/VICODIN) 5-325 MG per  tablet 1 tablet (1 tablet Oral Given 01/10/22 1925)    ED Course/ Medical Decision Making/ A&P Clinical Course as of 01/10/22 2035  Thu Jan 10, 2022  1859 Consulted with Dr. Yehuda Budd of orthopedic hand specialty.  Reviewed imaging discussed patient's case in detail.  Agrees with plan for immobilization with sugar-tong and close follow-up in the office on Monday.  No other recommendations at this time. [AC]    Clinical Course User Index [AC] Cecil Cobbs, PA-C                           Medical Decision Making Amount and/or Complexity of Data Reviewed Radiology: ordered.  Risk Prescription drug management.   66 y.o. female presents to the ED for concern of Wrist Pain     This involves an extensive number of treatment options, and is a complaint that carries with it a high risk of complications and morbidity.  The emergent differential diagnosis prior to evaluation includes, but is not limited to: wrist fracture, contusion, abrasion, sprain  This is not an exhaustive differential.   Past Medical History / Co-morbidities / Social History: Hx of hypercholesterolemia Social Determinants of Health include: Elderly  Additional History:  None  Imaging Studies: I ordered imaging studies including right wrist .   I independently visualized and interpreted imaging which showed  1. Markedly comminuted intra-articular fracture of the distal radius with mild dorsal apex angulation.  2. Mildly displaced and impacted oblique fracture of the distal ulnar metadiaphysis. I agree with the radiologist interpretation.  ED Course / Critical Interventions: Pt well-appearing on exam.  Presenting with acute right wrist pain and obvious deformity.  Closed injury from fall on outstretched hand.  No numbness or tingling of fingers or extremity.  Extremity appears neurovascularly intact.  Compartments soft.  No evidence of ischemia or compartment syndrome.  ROM of fingers intact.  No anatomical  snuffbox tenderness.  Pt X-Ray with evidence of radial and ulnar fracture -- see details above.  Consulted with Dr. Yehuda Budd of orthopedic hand specialty.  Recommended immobilization and follow up in office on Monday.  Pain managed in ED.  Patient given sugar-tong splint and sling while in ED.  After administration, extremity rechecked and remains neurovascularly intact.  Recommended RICE therapy and conservative symptom management until orthopedic follow up.   Pt in NAD and good condition at time of discharge. I have reviewed the patients home medicines and have made adjustments as needed.  Disposition: After consideration the patient's encounter today, I do not feel today's workup suggests an emergent condition requiring admission or immediate intervention beyond what has been performed at this time.  Safe for discharge; instructed to return immediately for worsening symptoms, change in symptoms or any other concerns.  I have reviewed the patients home medicines and have made adjustments as needed.  Discussed course of treatment with the patient, whom demonstrated understanding.  Patient in agreement and has no further questions.    This chart was dictated using voice recognition software.  Despite best efforts to proofread, errors can occur which can change the documentation meaning.         Final Clinical Impression(s) / ED Diagnoses Final diagnoses:  Right wrist pain  Closed fracture of distal end of right radius, unspecified fracture morphology, initial encounter  Closed fracture of distal end of right ulna, unspecified fracture morphology, initial encounter    Rx / DC Orders ED Discharge Orders     None         Sandrea Hammond 01/11/22 1917    Linwood Dibbles, MD 01/12/22 [phone]

## 2022-04-04 ENCOUNTER — Encounter (HOSPITAL_COMMUNITY): Payer: Self-pay | Admitting: Anesthesiology

## 2022-04-08 ENCOUNTER — Encounter (HOSPITAL_BASED_OUTPATIENT_CLINIC_OR_DEPARTMENT_OTHER): Payer: Self-pay | Admitting: Orthopedic Surgery

## 2022-04-08 ENCOUNTER — Other Ambulatory Visit: Payer: Self-pay

## 2022-04-08 NOTE — Progress Notes (Signed)
   04/08/22 1123  Pre-op Phone Call  Surgery Date Verified 05/15/22  Arrival Time Verified 0600  Surgery Location Verified [facility] [place]  Medical History Reviewed Yes  Is the patient taking a GLP-1 receptor agonist? No  Does the patient have diabetes? No diagnosis of diabetes  Do you have a history of heart problems? No  Antiarrhythmic device type  (NA)  Does patient have other implanted devices? No  Patient educated about smoking cessation 24 hours prior to surgery. N/A Non-Smoker  Patient verbalizes understanding of bowel prep? N/A  Med Rec Completed Yes  Take the Following Meds the Morning of Surgery hold mvi/herb/supp x5d  Recent  Lab Work, EKG, CXR? No  NPO (Including gum & candy) After midnight  Stop Solids, Milk, Candy, and Gum STARTING AT MIDNIGHT  Did patient view EMMI videos? No  Responsible adult to drive and be with you for 24 hours? Yes  Name & Phone Number for Ride/Caregiver probably daughter  No Jewelry, money, nail polish or make-up.  No lotions, powders, perfumes. No shaving  48 hrs. prior to surgery. Yes  Contacts, Dentures & Glasses Will Have to be Removed Before OR. Yes  Please bring your ID and Insurance Card the morning of your surgery. (Surgery Centers Only) Yes  Bring any papers or x-rays with you that your surgeon gave you. Yes  Instructed to contact the location of procedure/ provider if they or anyone in their household develops symptoms or tests positive for COVID-19, has close contact with someone who tests positive for COVID, or has known exposure to any contagious illness. Yes  Call this number the morning of surgery  with any problems that may cancel your surgery. [phone]  Covid-19 Assessment  Have you had a positive COVID-19 test within the previous 90 days? No  COVID Testing Guidance Proceed with the additional questions.  Patient's surgery required a COVID-19 test (cardiothoracic, complex ENT, and bronchoscopies/ EBUS) No  Have you been unmasked and in  close contact with anyone with COVID-19 or COVID-19 symptoms within the past 10 days? No  Do you or anyone in your household currently have any COVID-19 symptoms? No

## 2022-04-13 NOTE — H&P (Signed)
Preoperative History & Physical Exam  Surgeon: Matt Holmes, MD  Diagnosis: Right wrist fracture  Planned Procedure: Procedure(s) (LRB): Right wrist removal of hardware (dorsal spanning plate and screws), dissolvable sutures (Right)  History of Present Illness:   Patient is a 67 y.o. female with symptoms consistent with Right wrist fracture who presents for surgical intervention. The risks, benefits and alternatives of surgical intervention were discussed and informed consent was obtained prior to surgery.  Past Medical History:  Past Medical History:  Diagnosis Date   Hypercholesteremia     Past Surgical History:  Past Surgical History:  Procedure Laterality Date   hand surgery Left    spleenectomy     SPLENECTOMY, TOTAL      Medications:  Prior to Admission medications   Medication Sig Start Date End Date Taking? Authorizing Provider  cholecalciferol (VITAMIN D3) 25 MCG (1000 UNIT) tablet Take 1,000 Units by mouth daily.   Yes [provider]  Phenyleph-Doxylamine-DM-APAP (TYLENOL COLD/FLU/COUGH NIGHT) 5-6.25-10-325 MG/15ML LIQD Take by mouth.   Yes [provider]    Allergies:  Patient has no known allergies.  Review of Systems: Negative except per HPI.  Physical Exam: Alert and oriented, NAD Head and neck: no masses, normal alignment CV: pulse intact Pulm: no increased work of breathing, respirations even and unlabored Abdomen: non-distended Extremities: extremities warm and well perfused  LABS: No results found for this or any previous visit (from the past 2160 hour(s)).   Complete History and Physical exam available in the office notes  Orene Desanctis

## 2022-04-16 ENCOUNTER — Encounter (HOSPITAL_BASED_OUTPATIENT_CLINIC_OR_DEPARTMENT_OTHER): Payer: Self-pay | Admitting: Orthopedic Surgery

## 2022-04-16 ENCOUNTER — Ambulatory Visit (HOSPITAL_BASED_OUTPATIENT_CLINIC_OR_DEPARTMENT_OTHER)
Admission: RE | Admit: 2022-04-16 | Payer: No Typology Code available for payment source | Source: Ambulatory Visit | Admitting: Orthopedic Surgery

## 2022-04-16 ENCOUNTER — Ambulatory Visit (HOSPITAL_COMMUNITY)
Admission: EM | Admit: 2022-04-16 | Discharge: 2022-04-16 | Disposition: A | Discharge status: Home / Self Care | Payer: Medicare PPO | Attending: Family Medicine | Admitting: Family Medicine

## 2022-04-16 ENCOUNTER — Ambulatory Visit (INDEPENDENT_AMBULATORY_CARE_PROVIDER_SITE_OTHER): Payer: Medicare PPO

## 2022-04-16 ENCOUNTER — Encounter (HOSPITAL_COMMUNITY): Payer: Self-pay | Admitting: Emergency Medicine

## 2022-04-16 DIAGNOSIS — R062 Wheezing: Secondary | ICD-10-CM | POA: Diagnosis not present

## 2022-04-16 DIAGNOSIS — J069 Acute upper respiratory infection, unspecified: Secondary | ICD-10-CM | POA: Diagnosis not present

## 2022-04-16 DIAGNOSIS — R059 Cough, unspecified: Secondary | ICD-10-CM | POA: Diagnosis not present

## 2022-04-16 SURGERY — REMOVAL, HARDWARE
Anesthesia: Monitor Anesthesia Care | Laterality: Right

## 2022-04-16 MED ORDER — BUPIVACAINE HCL (PF) 0.5 % IJ SOLN
INTRAMUSCULAR | Status: AC
  Filled 2022-04-16: qty 30

## 2022-04-16 MED ORDER — LIDOCAINE HCL (PF) 1 % IJ SOLN
INTRAMUSCULAR | Status: AC
  Filled 2022-04-16: qty 30

## 2022-04-16 MED ORDER — BENZONATATE 200 MG PO CAPS: 200.0000 mg | ORAL_CAPSULE | Freq: Three times a day (TID) | ORAL | 0 refills | Status: AC

## 2022-04-16 MED ORDER — BACITRACIN ZINC 500 UNIT/GM EX OINT
TOPICAL_OINTMENT | CUTANEOUS | Status: AC
  Filled 2022-04-16: qty 28.35

## 2022-04-16 MED ORDER — METHYLPREDNISOLONE 4 MG PO TBPK: ORAL_TABLET | ORAL | 0 refills | Status: AC

## 2022-04-16 NOTE — ED Triage Notes (Signed)
Pt reports supposed to have surgery on her right arm today but since has cough, chest congestion and wheezing wasn't able. Pt reports had cough for a couple weeks. Been taking Mucinex for congestion.

## 2022-04-16 NOTE — Anesthesia Preprocedure Evaluation (Deleted)
Anesthesia Evaluation    Reviewed: Allergy & Precautions, Patient's Chart, lab work & pertinent test results  Airway        Dental   Pulmonary           Cardiovascular negative cardio ROS      Neuro/Psych negative neurological ROS  negative psych ROS   GI/Hepatic negative GI ROS, Neg liver ROS,,,  Endo/Other  negative endocrine ROS    Renal/GU negative Renal ROS     Musculoskeletal   Abdominal   Peds  Hematology negative hematology ROS (+)   Anesthesia Other Findings   Reproductive/Obstetrics                             Anesthesia Physical Anesthesia Plan  ASA: 2  Anesthesia Plan: MAC   Post-op Pain Management:    Induction: Intravenous  PONV Risk Score and Plan: 3 and Ondansetron, Propofol infusion and Midazolam  Airway Management Planned: Natural Airway and Nasal Cannula  Additional Equipment: None  Intra-op Plan:   Post-operative Plan:   Informed Consent:   Plan Discussed with: CRNA  Anesthesia Plan Comments:        Anesthesia Quick Evaluation

## 2022-04-16 NOTE — ED Provider Notes (Signed)
[place]    CSN: [id number] Arrival date & time: 04/16/22  1104      History   Chief Complaint Chief Complaint  Patient presents with   Wheezing   Cough    HPI Yesenia Galvan is a 67 y.o. female.   Patient has had a cold for several weeks.  More recently having some wheezing  and more chest congestion.   No sob noted.  No fevers/chills.  Mild drainage.  No n/v.  No h/o asthma or copd.  She has been taking otc mucinex for chest congestion.  She was supposed to have surgery today, but was told she couldn't and was advised to come here.        Past Medical History:  Diagnosis Date   Hypercholesteremia     There are no problems to display for this patient.   Past Surgical History:  Procedure Laterality Date   hand surgery Left    spleenectomy     SPLENECTOMY, TOTAL      OB History   No obstetric history on file.      Home Medications    Prior to Admission medications   Medication Sig Start Date End Date Taking? Authorizing Provider  cholecalciferol (VITAMIN D3) 25 MCG (1000 UNIT) tablet Take 1,000 Units by mouth daily.    [provider]  Phenyleph-Doxylamine-DM-APAP (TYLENOL COLD/FLU/COUGH NIGHT) 5-6.25-10-325 MG/15ML LIQD Take by mouth.    [provider]    Family History No family history on file.  Social History Social History   Tobacco Use   Smoking status: Never   Smokeless tobacco: Never  Substance Use Topics   Alcohol use: No   Drug use: No     Allergies   Patient has no known allergies.   Review of Systems Review of Systems  Constitutional: Negative.   HENT: Negative.    Respiratory:  Positive for cough and wheezing. Negative for shortness of breath.   Gastrointestinal: Negative.   Musculoskeletal: Negative.   Psychiatric/Behavioral: Negative.       Physical Exam Triage Vital Signs ED Triage Vitals  Enc Vitals Group     BP 04/16/22 1236 (!) 140/91     Pulse Rate 04/16/22 1236 87      Resp 04/16/22 1236 18     Temp 04/16/22 1236 97.9 F (36.6 C)     Temp Source 04/16/22 1236 Oral     SpO2 04/16/22 1236 94 %     Weight --      Height --      Head Circumference --      Peak Flow --      Pain Score 04/16/22 1234 0     Pain Loc --      Pain Edu? --      Excl. in [place]? --    No data found.  Updated Vital Signs BP (!) 140/91 (BP Location: Left Arm)   Pulse 87   Temp 97.9 F (36.6 C) (Oral)   Resp 18   SpO2 94%   Visual Acuity Right Eye Distance:   Left Eye Distance:   Bilateral Distance:    Right Eye Near:   Left Eye Near:    Bilateral Near:     Physical Exam Constitutional:      Appearance: Normal appearance.  HENT:     Right Ear: Tympanic membrane normal.     Left Ear: Tympanic membrane normal.     Nose: Nose normal.  Cardiovascular:     Rate  and Rhythm: Normal rate and regular rhythm.  Pulmonary:     Effort: Pulmonary effort is normal.     Breath sounds: Normal breath sounds. No wheezing.  Musculoskeletal:     Cervical back: Normal range of motion and neck supple. No tenderness.  Skin:    General: Skin is warm.  Neurological:     General: No focal deficit present.     Mental Status: She is alert.  Psychiatric:        Mood and Affect: Mood normal.      UC Treatments / Results  Labs (all labs ordered are listed, but only abnormal results are displayed) Labs Reviewed - No data to display  EKG   Radiology DG Chest 2 View  Result Date: 04/16/2022 CLINICAL DATA:  cough, wheezing EXAM: CHEST - 2 VIEW COMPARISON:  None Available. FINDINGS: The cardiomediastinal silhouette is mildly enlarged in contour.Tortuous thoracic aorta. No pleural effusion. No pneumothorax. Mildly prominent bronchial wall markings without focal consolidation identified. Visualized abdomen is unremarkable. Multilevel degenerative changes of the thoracic spine. IMPRESSION: Mildly prominent bronchial wall markings without focal consolidation. This is nonspecific but can be  seen in the setting of small airways disease/bronchitis. Electronically Signed   By: Valentino Saxon M.D.   On: 04/16/2022 13:01    Procedures Procedures (including critical care time)  Medications Ordered in UC Medications - No data to display  Initial Impression / Assessment and Plan / UC Course  I have reviewed the triage vital signs and the nursing notes.  Pertinent labs & imaging results that were available during my care of the patient were reviewed by me and considered in my medical decision making (see chart for details).   Final Clinical Impressions(s) / UC Diagnoses   Final diagnoses:  Upper respiratory tract infection, unspecified type     Discharge Instructions      You were seen today for cough.  Your xray does not show pneumonia or bacterial infection.  This is likely viral in nature.  I have sent out several medications to help with the cough and wheezing.   If this is not improving in the next week, then please return for further evaluation.      ED Prescriptions     Medication Sig Dispense Auth. Provider   methylPREDNISolone (MEDROL DOSEPAK) 4 MG TBPK tablet Take as directed 1 each Shareta Fishbaugh, MD   benzonatate (TESSALON) 200 MG capsule Take 1 capsule (200 mg total) by mouth 3 (three) times daily for 7 days. 21 capsule Rondel Oh, MD      PDMP not reviewed this encounter.   Rondel Oh, MD 04/16/22 [phone]

## 2022-04-16 NOTE — Discharge Instructions (Signed)
You were seen today for cough.  Your xray does not show pneumonia or bacterial infection.  This is likely viral in nature.  I have sent out several medications to help with the cough and wheezing.   If this is not improving in the next week, then please return for further evaluation.

## 2022-05-07 ENCOUNTER — Encounter (HOSPITAL_BASED_OUTPATIENT_CLINIC_OR_DEPARTMENT_OTHER): Payer: Self-pay | Admitting: Orthopedic Surgery

## 2022-05-07 ENCOUNTER — Other Ambulatory Visit: Payer: Self-pay

## 2022-05-14 ENCOUNTER — Ambulatory Visit (HOSPITAL_BASED_OUTPATIENT_CLINIC_OR_DEPARTMENT_OTHER)
Admission: RE | Admit: 2022-05-14 | Payer: No Typology Code available for payment source | Source: Home / Self Care | Admitting: Orthopedic Surgery

## 2022-05-14 DIAGNOSIS — Z01818 Encounter for other preprocedural examination: Secondary | ICD-10-CM

## 2022-05-14 SURGERY — REMOVAL, HARDWARE
Anesthesia: Monitor Anesthesia Care | Laterality: Right

## 2022-05-22 ENCOUNTER — Other Ambulatory Visit: Payer: Self-pay | Admitting: Student

## 2022-05-22 DIAGNOSIS — Z780 Asymptomatic menopausal state: Secondary | ICD-10-CM
